# Patient Record
Sex: Male | Born: 1990 | Race: White | Hispanic: No | Marital: Single | Smoking: Never smoker
Health system: Southern US, Community
[De-identification: ages and names within clinical notes are randomized; demographics above are authoritative.]

## PROBLEM LIST (undated history)

## (undated) HISTORY — PX: TONSILLECTOMY: SUR1361

---
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
## 2018-05-25 ENCOUNTER — Other Ambulatory Visit: Payer: Self-pay

## 2018-05-25 ENCOUNTER — Encounter (HOSPITAL_BASED_OUTPATIENT_CLINIC_OR_DEPARTMENT_OTHER): Payer: Self-pay

## 2018-05-25 ENCOUNTER — Emergency Department (HOSPITAL_BASED_OUTPATIENT_CLINIC_OR_DEPARTMENT_OTHER): Payer: Self-pay

## 2018-05-25 ENCOUNTER — Emergency Department (HOSPITAL_BASED_OUTPATIENT_CLINIC_OR_DEPARTMENT_OTHER)
Admission: EM | Admit: 2018-05-25 | Discharge: 2018-05-25 | Disposition: A | Discharge status: Home / Self Care | Payer: Self-pay | Attending: Emergency Medicine | Admitting: Emergency Medicine

## 2018-05-25 DIAGNOSIS — K2920 Alcoholic gastritis without bleeding: Secondary | ICD-10-CM | POA: Insufficient documentation

## 2018-05-25 DIAGNOSIS — R112 Nausea with vomiting, unspecified: Secondary | ICD-10-CM

## 2018-05-25 DIAGNOSIS — K8020 Calculus of gallbladder without cholecystitis without obstruction: Secondary | ICD-10-CM | POA: Insufficient documentation

## 2018-05-25 DIAGNOSIS — K828 Other specified diseases of gallbladder: Secondary | ICD-10-CM | POA: Insufficient documentation

## 2018-05-25 DIAGNOSIS — D729 Disorder of white blood cells, unspecified: Secondary | ICD-10-CM | POA: Insufficient documentation

## 2018-05-25 DIAGNOSIS — R1010 Upper abdominal pain, unspecified: Secondary | ICD-10-CM | POA: Insufficient documentation

## 2018-05-25 LAB — COMPREHENSIVE METABOLIC PANEL
ALBUMIN: 5 g/dL (ref 3.5–5.0)
ALT: 15 U/L (ref 0–44)
AST: 39 U/L (ref 15–41)
Alkaline Phosphatase: 49 U/L (ref 38–126)
Anion gap: 15 (ref 5–15)
BUN: 9 mg/dL (ref 6–20)
CO2: 18 mmol/L — AB (ref 22–32)
CREATININE: 1.05 mg/dL (ref 0.61–1.24)
Calcium: 9.6 mg/dL (ref 8.9–10.3)
Chloride: 108 mmol/L (ref 98–111)
GFR calc Af Amer: >60 mL/min (ref >60)
GFR calc non Af Amer: >60 mL/min (ref >60)
GLUCOSE: 134 mg/dL — AB (ref 70–99)
Potassium: 3.5 mmol/L (ref 3.5–5.1)
SODIUM: 141 mmol/L (ref 135–145)
Total Bilirubin: 0.7 mg/dL (ref 0.3–1.2)
Total Protein: 8 g/dL (ref 6.5–8.1)

## 2018-05-25 LAB — LIPASE, BLOOD: LIPASE: 28 U/L (ref 11–51)

## 2018-05-25 LAB — CBC WITH DIFFERENTIAL/PLATELET
BASOS ABS: 0 10*3/uL (ref 0.0–0.1)
BASOS PCT: 0 %
EOS PCT: 0 %
Eosinophils Absolute: 0 10*3/uL (ref 0.0–0.7)
HCT: 41.9 % (ref 39.0–52.0)
Hemoglobin: 14.6 g/dL (ref 13.0–17.0)
Lymphocytes Relative: 5 %
Lymphs Abs: 0.7 10*3/uL (ref 0.7–4.0)
MCH: 29.3 pg (ref 26.0–34.0)
MCHC: 34.8 g/dL (ref 30.0–36.0)
MCV: 84.1 fL (ref 78.0–100.0)
MONO ABS: 0.7 10*3/uL (ref 0.1–1.0)
Monocytes Relative: 4 %
NEUTROS ABS: 14.8 10*3/uL — AB (ref 1.7–7.7)
Neutrophils Relative %: 91 %
PLATELETS: 273 10*3/uL (ref 150–400)
RBC: 4.98 MIL/uL (ref 4.22–5.81)
RDW: 13.3 % (ref 11.5–15.5)
WBC: 16.2 10*3/uL — AB (ref 4.0–10.5)

## 2018-05-25 LAB — URINALYSIS, ROUTINE W REFLEX MICROSCOPIC
BILIRUBIN URINE: NEGATIVE
Glucose, UA: NEGATIVE mg/dL
Hgb urine dipstick: NEGATIVE
KETONES UR: 15 mg/dL — AB
LEUKOCYTES UA: NEGATIVE
Nitrite: NEGATIVE
Protein, ur: NEGATIVE mg/dL
Specific Gravity, Urine: 1.015 (ref 1.005–1.030)
pH: 8.5 — ABNORMAL HIGH (ref 5.0–8.0)

## 2018-05-25 LAB — MAGNESIUM: MAGNESIUM: 1.6 mg/dL — AB (ref 1.7–2.4)

## 2018-05-25 LAB — CK: Total CK: 216 U/L (ref 49–397)

## 2018-05-25 MED ORDER — ONDANSETRON HCL 4 MG/2ML IJ SOLN
4.0000 mg | Freq: Once | INTRAMUSCULAR | Status: AC
  Administered 2018-05-25: 4 mg via INTRAVENOUS
  Filled 2018-05-25: qty 2

## 2018-05-25 MED ORDER — GI COCKTAIL ~~LOC~~
30.0000 mL | Freq: Once | ORAL | Status: AC
  Administered 2018-05-25: 30 mL via ORAL
  Filled 2018-05-25: qty 30

## 2018-05-25 MED ORDER — RANITIDINE HCL 150 MG PO TABS: 150.0000 mg | ORAL_TABLET | Freq: Two times a day (BID) | ORAL | 0 refills | Status: DC

## 2018-05-25 MED ORDER — DIPHENHYDRAMINE HCL 50 MG/ML IJ SOLN
25.0000 mg | Freq: Once | INTRAMUSCULAR | Status: AC
  Administered 2018-05-25: 25 mg via INTRAVENOUS
  Filled 2018-05-25: qty 1

## 2018-05-25 MED ORDER — FAMOTIDINE IN NACL 20-0.9 MG/50ML-% IV SOLN
20.0000 mg | Freq: Once | INTRAVENOUS | Status: AC
  Administered 2018-05-25: 20 mg via INTRAVENOUS
  Filled 2018-05-25: qty 50

## 2018-05-25 MED ORDER — SODIUM CHLORIDE 0.9 % IV BOLUS
2000.0000 mL | Freq: Once | INTRAVENOUS | Status: AC
  Administered 2018-05-25: 2000 mL via INTRAVENOUS

## 2018-05-25 MED ORDER — METOCLOPRAMIDE HCL 5 MG/ML IJ SOLN
10.0000 mg | Freq: Once | INTRAMUSCULAR | Status: AC
  Administered 2018-05-25: 10 mg via INTRAVENOUS
  Filled 2018-05-25: qty 2

## 2018-05-25 MED ORDER — METOCLOPRAMIDE HCL 10 MG PO TABS: 10.0000 mg | ORAL_TABLET | Freq: Four times a day (QID) | ORAL | 0 refills | Status: DC | PRN

## 2018-05-25 MED ORDER — SODIUM CHLORIDE 0.9 % IV BOLUS
1000.0000 mL | Freq: Once | INTRAVENOUS | Status: AC
  Administered 2018-05-25: 1000 mL via INTRAVENOUS

## 2018-05-25 NOTE — Discharge Instructions (Addendum)
Your abdominal pain is likely from gastritis or an ulcer, however you do have a gallstone and some gallbladder sludge in your gallbladder which could potentially be contributing to your symptoms today. You will need to follow up with a surgeon in the next few weeks to discuss whether your gallbladder needs to be removed or not.  You will need to take zantac as directed, and avoid spicy/fatty/acidic foods, avoid soda/coffee/tea/alcohol. Avoid laying down flat within 30 minutes of eating. Avoid NSAIDs like ibuprofen/aleve/motrin/etc on an empty stomach. May consider using over the counter tums/maalox as needed for additional relief. Use zofran given to you by the urgent care, or reglan given to you here, as directed as needed for nausea. Use tylenol as needed for pain. [facility] for recheck of symptoms and to establish medical care, as well as with the surgeon as previously mentioned. Return to the ER for changes or worsening symptoms.  Abdominal (belly) pain can be caused by many things. Your caregiver performed an examination and possibly ordered blood/urine tests and imaging (CT scan, x-rays, ultrasound). Many cases can be observed and treated at home after initial evaluation in the emergency department. Even though you are being discharged home, abdominal pain can be unpredictable. Therefore, you need a repeated exam if your pain does not resolve, returns, or worsens. Most patients with abdominal pain don't have to be admitted to the hospital or have surgery, but serious problems like appendicitis and gallbladder attacks can start out as nonspecific pain. Many abdominal conditions cannot be diagnosed in one visit, so follow-up evaluations are very important. SEEK IMMEDIATE MEDICAL ATTENTION IF YOU DEVELOP ANY OF THE FOLLOWING SYMPTOMS: The pain does not go away or becomes severe.  A temperature above 101 develops.  Repeated vomiting occurs (multiple  episodes).  The pain becomes localized to portions of the abdomen. The right side could possibly be appendicitis. In an adult, the left lower portion of the abdomen could be colitis or diverticulitis.  Blood is being passed in stools or vomit (bright red or black tarry stools).  Return also if you develop chest pain, difficulty breathing, dizziness or fainting, or become confused, poorly responsive, or inconsolable (young children). The constipation stays for more than 4 days.  There is belly (abdominal) or rectal pain.  You do not seem to be getting better.

## 2018-05-25 NOTE — ED Provider Notes (Signed)
[facility] EMERGENCY DEPARTMENT Provider Note   CSN: [id number] Arrival date & time: 05/25/18  1655     History   Chief Complaint Chief Complaint  Patient presents with  . Emesis    HPI Thomas Daniel is a 27 y.o. male with no reported PMHx, who presents to the ED with complaints of "feeling dehydrated" that began this morning.  Patient states that this morning he woke up vomiting, has had too numerous to count episodes of nonbloody nonbilious emesis, and feels generally weak/fatigued, having cramping in his hands bilaterally, and having abdominal pain.  He describes his pain as 8/10 intermittent aching nonradiating epigastric pain that worsens with activity and with no treatments for pain tried prior to arrival.  Chart review reveals that he went to [facility] just PTA, received 25mg  IM phenergan and rx for 8mg  ODT zofran and was discharged with instructions to drink copious amounts of fluids/gatorade.  He states that he left there and came immediately here because he felt so dehydrated and felt like the phenergan didn't really help much, so he came here for ongoing evaluation.  He did not pick up the rx for Zofran yet.  He admits that he drank 5 shots of liquor last night, but states that this is never happened to him just from alcohol use.  He denies having any medical problems and does not have a PCP currently.  He denies fevers, chills, CP, SOB, diarrhea/constipation, obstipation, melena, hematochezia, hematemesis, hematuria, dysuria, arthralgias, numbness, tingling, focal weakness, or any other complaints at this time. Denies recent travel, sick contacts, suspicious food intake, NSAID use, or prior abd surgeries.  Of note, the person he came with mentions that he looks a little yellow today.   The history is provided by the patient and medical records. No language interpreter was used.    History reviewed. No pertinent past medical history.  There are no active problems to  display for this patient.   Past Surgical History:  Procedure Laterality Date  . TONSILLECTOMY          Home Medications    Prior to Admission medications   Not on File    Family History No family history on file.  Social History Social History   Tobacco Use  . Smoking status: Never Smoker  . Smokeless tobacco: Never Used  Substance Use Topics  . Alcohol use: Yes    Comment: weekly  . Drug use: Not on file     Allergies   Patient has no known allergies.   Review of Systems Review of Systems  Constitutional: Positive for fatigue. Negative for chills and fever.  Respiratory: Negative for shortness of breath.   Cardiovascular: Negative for chest pain.  Gastrointestinal: Positive for abdominal pain, nausea and vomiting. Negative for blood in stool, constipation and diarrhea.  Genitourinary: Negative for dysuria and hematuria.  Musculoskeletal: Positive for myalgias (cramping in hands). Negative for arthralgias.  Skin: Positive for color change (?yellowish).  Allergic/Immunologic: Negative for immunocompromised state.  Neurological: Negative for weakness and numbness.  Psychiatric/Behavioral: Negative for confusion.   All other systems reviewed and are negative for acute change except as noted in the HPI.    Physical Exam Updated Vital Signs BP 119/70 (BP Location: Right Arm)   Pulse 90   Temp 97.8 F (36.6 C) (Oral)   Resp 20   Ht 5\' 8"  (1.727 m)   Wt 59 kg (130 lb)   SpO2 98%   BMI 19.77 kg/m  Physical Exam  Constitutional: He is oriented to person, place, and time. Vital signs are normal. He appears well-developed and well-nourished.  Non-toxic appearance. No distress.  Afebrile, nontoxic, looks like he feels unwell and dry heaving but otherwise in NAD  HENT:  Head: Normocephalic and atraumatic.  Mouth/Throat: Oropharynx is clear and moist and mucous membranes are normal.  Eyes: Conjunctivae and EOM are normal. Right eye exhibits no discharge.  Left eye exhibits no discharge.  Neck: Normal range of motion. Neck supple.  Cardiovascular: Normal rate, regular rhythm, normal heart sounds and intact distal pulses. Exam reveals no gallop and no friction rub.  No murmur heard. Pulmonary/Chest: Effort normal and breath sounds normal. No respiratory distress. He has no decreased breath sounds. He has no wheezes. He has no rhonchi. He has no rales.  Abdominal: Soft. Normal appearance and bowel sounds are normal. He exhibits no distension. There is tenderness in the right upper quadrant and epigastric area. There is positive Murphy's sign. There is no rigidity, no rebound, no guarding, no CVA tenderness and no tenderness at McBurney's point.  Soft, nondistended, +BS throughout, with moderate epigastric and RUQ TTP, no r/g/r, +murphy's, neg mcburney's, no CVA TTP   Musculoskeletal: Normal range of motion.  Neurological: He is alert and oriented to person, place, and time. He has normal strength. No sensory deficit.  Skin: Skin is warm, dry and intact. No rash noted.  Looks slightly jaundiced vs just somewhat pale  Psychiatric: He has a normal mood and affect.  Nursing note and vitals reviewed.    ED Treatments / Results  Labs (all labs ordered are listed, but only abnormal results are displayed) Labs Reviewed  CBC WITH DIFFERENTIAL/PLATELET - Abnormal; Notable for the following components:      Result Value   WBC 16.2 (*)    Neutro Abs 14.8 (*)    All other components within normal limits  COMPREHENSIVE METABOLIC PANEL - Abnormal; Notable for the following components:   CO2 18 (*)    Glucose, Bld 134 (*)    All other components within normal limits  URINALYSIS, ROUTINE W REFLEX MICROSCOPIC - Abnormal; Notable for the following components:   pH 8.5 (*)    Ketones, ur 15 (*)    All other components within normal limits  MAGNESIUM - Abnormal; Notable for the following components:   Magnesium 1.6 (*)    All other components within normal  limits  LIPASE, BLOOD  CK    EKG None  Radiology US Abdomen Complete  Result Date: 05/25/2018 CLINICAL DATA:  Initial evaluation for acute nausea, vomiting for 12 hours. EXAM: ABDOMEN ULTRASOUND COMPLETE COMPARISON:  None. FINDINGS: Gallbladder: Tiny 2 mm layering stones seen within the gallbladder lumen. Small amount of sludge noted as well. No significant free pericholecystic fluid. Gallbladder wall measures within normal limits. 4 mm echogenic lesion adherent to the gallbladder wall likely reflects a small polyp, of doubtful significance. No sonographic Murphy sign indicated by sonography. Common bile duct: Diameter: 4 mm Liver: No focal lesion identified. Within normal limits in parenchymal echogenicity. Portal vein is patent on color Doppler imaging with normal direction of blood flow towards the liver. IVC: No abnormality visualized. Pancreas: Visualized portion unremarkable. Spleen: Size and appearance within normal limits. Right Kidney: Length: 10.2 cm. Echogenicity within normal limits. No mass or hydronephrosis visualized. Left Kidney: Length: 10.4 cm. Echogenicity within normal limits. No mass or hydronephrosis visualized. Abdominal aorta: No aneurysm visualized. Other findings: None. IMPRESSION: 1. Small stone with sludge within  the gallbladder lumen. No other sonographic features for acute cholecystitis. 2. No biliary dilatation. 3. 4 mm polyp within the gallbladder lumen, felt to be an incidental finding of no clinical significance. Electronically Signed   By: Rise Mu M.D.   On: 05/25/2018 19:30    Procedures Procedures (including critical care time)  Medications Ordered in ED Medications  sodium chloride 0.9 % bolus 1,000 mL (1,000 mLs Intravenous New Bag/Given 05/25/18 2104)  sodium chloride 0.9 % bolus 2,000 mL (0 mLs Intravenous Stopped 05/25/18 1841)  ondansetron (ZOFRAN) injection 4 mg (4 mg Intravenous Given 05/25/18 1740)  famotidine (PEPCID) IVPB 20 mg premix (0  mg Intravenous Stopped 05/25/18 1814)  gi cocktail (Maalox,Lidocaine,Donnatal) (30 mLs Oral Given 05/25/18 1817)  metoCLOPramide (REGLAN) injection 10 mg (10 mg Intravenous Given 05/25/18 2104)  diphenhydrAMINE (BENADRYL) injection 25 mg (25 mg Intravenous Given 05/25/18 2108)     Initial Impression / Assessment and Plan / ED Course  I have reviewed the triage vital signs and the nursing notes.  Pertinent labs & imaging results that were available during my care of the patient were reviewed by me and considered in my medical decision making (see chart for details).     27 y.o. male here with upper abd pain and n/v that began this morning. States he's also having hand cramping and generalized weakness. Seen at [facility] just PTA, given 25mg  IM phenergan which didn't help, he's concerned he's dehydrated so he came here. On exam, appears slightly jaundiced, moderate epigastric and RUQ TTP, +murphy's, nonperitoneal. VSS. Will get labs including CK and Mg levels, and get abd U/S to assess for biliary tree etiology; will give pepcid, zofran, fluids, and GI cocktail then reassess shortly.   8:46 PM CBC w/diff with neutrophilic leukocytosis WBC 16.2. CMP with bicarb 18 and gluc 134 but otherwise WNL, no anion gap, kidney function stable; low bicarb likely from mild dehydration. Lipase WNL. Mg level marginally low at 1.6, doubt need for repletion. CK level WNL at 216. U/A with a few ketones but otherwise without UTI. Abd U/S with small stone with sludge in gallbladder without evidence of cholecystitis, no biliary dilation, and 4mm polyp in gallbladder which is likely incidental.  Pt initially felt somewhat better but now vomiting again; will attempt reglan/benadryl and reassess. If this doesn't work, may consider ativan or haldol, but will hold off for now. Will give another 1L bolus since he still complains of feeling dehydrated despite 2L bolus. Will reassess shortly.   9:55 PM Pt feeling better and tolerating  PO well. Overall, symptoms consistent with gastritis/GERD/PUD but could be in part due to gallbladder pathology. Discussed diet/lifestyle modifications for symptoms, will start on zantac/reglan, discussed that he can also use home zofran as well, advised tylenol and avoidance/sparing use of NSAIDs only on full stomach, discussed other OTC remedies for symptomatic relief, and f/up with [facility] in 5-7 days for recheck of symptoms and ongoing evaluation/management, and to establish medical care. EtOH cessation strongly encouraged. Could still be biliary dyskinesia, advised possible further outpatient work up with HIDA scan and possible surgical consultation to see if he needs his gallbladder taken out. Will give referral to surgery, advised to f/up in a few weeks if symptoms become recurrent/persistent. I explained the diagnosis and have given explicit precautions to return to the ER including for any other new or worsening symptoms. The patient understands and accepts the medical plan as it's been dictated and I have answered their questions. Discharge instructions concerning home care  and prescriptions have been given. The patient is STABLE and is discharged to home in good condition.    Final Clinical Impressions(s) / ED Diagnoses   Final diagnoses:  Upper abdominal pain  Nausea and vomiting in adult patient  Acute alcoholic gastritis without hemorrhage  Neutrophilic leukocytosis  Gallbladder sludge  Calculus of gallbladder without cholecystitis without obstruction    ED Discharge Orders        Ordered    metoCLOPramide (REGLAN) 10 MG tablet  Every 6 hours PRN     05/25/18 2137    ranitidine (ZANTAC) 150 MG tablet  2 times daily     05/25/18 2137       Kostantinos Tallman, Pleasant PlainMercedes, [place] JerseyPA-C 05/25/18 2155    Pricilla LovelessGoldston, [place], MD 05/27/18 1306

## 2018-05-25 NOTE — ED Triage Notes (Signed)
[facility] was given phenergan and came directly here because he states he needs fluids and is unable to hold anything down, pt c/o abdominal cramping, drank alcohol last night but states that his symptoms are not from that

## 2018-08-25 ENCOUNTER — Encounter (HOSPITAL_BASED_OUTPATIENT_CLINIC_OR_DEPARTMENT_OTHER): Payer: Self-pay | Admitting: *Deleted

## 2018-08-25 ENCOUNTER — Emergency Department (HOSPITAL_BASED_OUTPATIENT_CLINIC_OR_DEPARTMENT_OTHER): Payer: Self-pay

## 2018-08-25 ENCOUNTER — Emergency Department (HOSPITAL_BASED_OUTPATIENT_CLINIC_OR_DEPARTMENT_OTHER)
Admission: EM | Admit: 2018-08-25 | Discharge: 2018-08-25 | Disposition: A | Discharge status: Home / Self Care | Payer: Self-pay | Attending: Emergency Medicine | Admitting: Emergency Medicine

## 2018-08-25 ENCOUNTER — Other Ambulatory Visit: Payer: Self-pay

## 2018-08-25 DIAGNOSIS — R1011 Right upper quadrant pain: Secondary | ICD-10-CM | POA: Insufficient documentation

## 2018-08-25 DIAGNOSIS — K8080 Other cholelithiasis without obstruction: Secondary | ICD-10-CM | POA: Insufficient documentation

## 2018-08-25 DIAGNOSIS — Z79899 Other long term (current) drug therapy: Secondary | ICD-10-CM | POA: Insufficient documentation

## 2018-08-25 LAB — CBC WITH DIFFERENTIAL/PLATELET
Abs Immature Granulocytes: 0.06 10*3/uL (ref 0.00–0.07)
BASOS ABS: 0.1 10*3/uL (ref 0.0–0.1)
BASOS PCT: 1 %
Eosinophils Absolute: 0 10*3/uL (ref 0.0–0.5)
Eosinophils Relative: 0 %
HEMATOCRIT: 46 % (ref 39.0–52.0)
Hemoglobin: 15.2 g/dL (ref 13.0–17.0)
Immature Granulocytes: 1 %
LYMPHS PCT: 7 %
Lymphs Abs: 0.9 10*3/uL (ref 0.7–4.0)
MCH: 28.5 pg (ref 26.0–34.0)
MCHC: 33 g/dL (ref 30.0–36.0)
MCV: 86.3 fL (ref 80.0–100.0)
MONO ABS: 0.6 10*3/uL (ref 0.1–1.0)
Monocytes Relative: 5 %
NRBC: 0 % (ref 0.0–0.2)
Neutro Abs: 11.2 10*3/uL — ABNORMAL HIGH (ref 1.7–7.7)
Neutrophils Relative %: 86 %
PLATELETS: 290 10*3/uL (ref 150–400)
RBC: 5.33 MIL/uL (ref 4.22–5.81)
RDW: 13 % (ref 11.5–15.5)
WBC: 12.8 10*3/uL — AB (ref 4.0–10.5)

## 2018-08-25 LAB — RAPID URINE DRUG SCREEN, HOSP PERFORMED
AMPHETAMINES: NOT DETECTED
BARBITURATES: NOT DETECTED
Benzodiazepines: NOT DETECTED
COCAINE: NOT DETECTED
Opiates: NOT DETECTED
Tetrahydrocannabinol: POSITIVE — AB

## 2018-08-25 LAB — COMPREHENSIVE METABOLIC PANEL
ALBUMIN: 5.1 g/dL — AB (ref 3.5–5.0)
ALT: 12 U/L (ref 0–44)
AST: 31 U/L (ref 15–41)
Alkaline Phosphatase: 44 U/L (ref 38–126)
Anion gap: 16 — ABNORMAL HIGH (ref 5–15)
BUN: 11 mg/dL (ref 6–20)
CHLORIDE: 106 mmol/L (ref 98–111)
CO2: 17 mmol/L — AB (ref 22–32)
Calcium: 10.1 mg/dL (ref 8.9–10.3)
Creatinine, Ser: 1.2 mg/dL (ref 0.61–1.24)
GFR calc Af Amer: >60 mL/min (ref >60)
GFR calc non Af Amer: >60 mL/min (ref >60)
GLUCOSE: 137 mg/dL — AB (ref 70–99)
Potassium: 3.3 mmol/L — ABNORMAL LOW (ref 3.5–5.1)
SODIUM: 139 mmol/L (ref 135–145)
Total Bilirubin: 1 mg/dL (ref 0.3–1.2)
Total Protein: 8.1 g/dL (ref 6.5–8.1)

## 2018-08-25 LAB — URINALYSIS, ROUTINE W REFLEX MICROSCOPIC
Bilirubin Urine: NEGATIVE
GLUCOSE, UA: NEGATIVE mg/dL
Ketones, ur: >80 mg/dL — AB
Leukocytes, UA: NEGATIVE
NITRITE: NEGATIVE
PH: 8.5 — AB (ref 5.0–8.0)
Protein, ur: NEGATIVE mg/dL
Specific Gravity, Urine: 1.02 (ref 1.005–1.030)

## 2018-08-25 LAB — LIPASE, BLOOD: Lipase: 27 U/L (ref 11–51)

## 2018-08-25 LAB — URINALYSIS, MICROSCOPIC (REFLEX)

## 2018-08-25 MED ORDER — ONDANSETRON HCL 8 MG PO TABS
4.0000 mg | ORAL_TABLET | Freq: Once | ORAL | Status: AC
  Administered 2018-08-25: 4 mg via ORAL
  Filled 2018-08-25: qty 1

## 2018-08-25 MED ORDER — SODIUM CHLORIDE 0.9 % IV BOLUS
1000.0000 mL | Freq: Once | INTRAVENOUS | Status: AC
Start: 2018-08-25 — End: 2018-08-25
  Administered 2018-08-25: 1000 mL via INTRAVENOUS

## 2018-08-25 MED ORDER — ONDANSETRON HCL 4 MG/2ML IJ SOLN
4.0000 mg | Freq: Once | INTRAMUSCULAR | Status: AC
  Administered 2018-08-25: 4 mg via INTRAVENOUS
  Filled 2018-08-25: qty 2

## 2018-08-25 MED ORDER — HYDROMORPHONE HCL 1 MG/ML IJ SOLN
0.5000 mg | Freq: Once | INTRAMUSCULAR | Status: AC
  Administered 2018-08-25: 0.5 mg via INTRAVENOUS
  Filled 2018-08-25: qty 1

## 2018-08-25 NOTE — ED Notes (Signed)
Pt states he is unable to void at this time.  IV fluids running to gravity, due to patient unable to keep arm straight and doesn't like the pump beeping.  Fluids running well at present.

## 2018-08-25 NOTE — Discharge Instructions (Signed)
Please read and follow all provided instructions.  Your diagnoses today include:  1. Biliary calculus of other site without obstruction   2. Right upper quadrant pain     Tests performed today include:  Blood counts and electrolytes  Blood tests to check liver and kidney function -normal  Blood tests to check pancreas function -normal  Urine test to look for infection  Ultrasound -shows that you may have a small stone in the tube outside of the gallbladder  Vital signs. See below for your results today.   Medications prescribed:   None  Take any prescribed medications only as directed.  Home care instructions:   Follow any educational materials contained in this packet.  Follow-up instructions: Please follow-up with your primary care provider in the next 3 days for further evaluation of your symptoms and the surgeon listed in the next week.   Return instructions:  SEEK IMMEDIATE MEDICAL ATTENTION IF:  The pain does not go away or becomes severe   A temperature above 101F develops   Repeated vomiting occurs (multiple episodes)   If you develop yellow coloring in your skin or severe abdominal pain that goes to your back.  The pain becomes localized to portions of the abdomen. The right side could possibly be appendicitis. In an adult, the left lower portion of the abdomen could be colitis or diverticulitis.   Blood is being passed in stools or vomit (bright red or black tarry stools)   You develop chest pain, difficulty breathing, dizziness or fainting, or become confused, poorly responsive, or inconsolable (young children)  If you have any other emergent concerns regarding your health  Additional Information: Abdominal (belly) pain can be caused by many things. Your caregiver performed an examination and possibly ordered blood/urine tests and imaging (CT scan, x-rays, ultrasound). Many cases can be observed and treated at home after initial evaluation in the  emergency department. Even though you are being discharged home, abdominal pain can be unpredictable. Therefore, you need a repeated exam if your pain does not resolve, returns, or worsens. Most patients with abdominal pain don't have to be admitted to the hospital or have surgery, but serious problems like appendicitis and gallbladder attacks can start out as nonspecific pain. Many abdominal conditions cannot be diagnosed in one visit, so follow-up evaluations are very important.  Your vital signs today were: BP 91/62 (BP Location: Right Arm)    Pulse 84    Temp 97.8 F (36.6 C) (Oral)    Resp 18    Ht 5\' 8"  (1.727 m)    Wt 59 kg    SpO2 100%    BMI 19.77 kg/m  If your blood pressure (bp) was elevated above 135/85 this visit, please have this repeated by your doctor within one month. --------------

## 2018-08-25 NOTE — ED Triage Notes (Signed)
Mom states that pt has been seen here before for dehydration, she said that he is showing same signs and symptoms as before. Has vomited 12 times today.

## 2018-08-25 NOTE — ED Provider Notes (Signed)
[facility] EMERGENCY DEPARTMENT Provider Note   CSN: [id number] Arrival date & time: 08/25/18  1303     History   Chief Complaint Chief Complaint  Patient presents with  . Dehydration    HPI Thomas Daniel is a 27 y.o. male.  Patient with known cholelithiasis and gallbladder sludge presents with upper abdominal pain and vomiting starting acutely approximately 10 AM today.  Patient has been persistently vomiting since that time.  Pain does not radiate.  No chest pain or shortness of breath.  No fevers.  He has had some watery stool without blood.  No urinary symptoms.  Patient is concerned that he is dehydrated.  Denies alcohol use.  Patient admits to smoking marijuana approximately 1 time per day.  The onset of this condition was acute. The course is constant. Aggravating factors: palpation. Alleviating factors: none.       History reviewed. No pertinent past medical history.  There are no active problems to display for this patient.   Past Surgical History:  Procedure Laterality Date  . TONSILLECTOMY          Home Medications    Prior to Admission medications   Medication Sig Start Date End Date Taking? Authorizing Provider  metoCLOPramide (REGLAN) 10 MG tablet Take 1 tablet (10 mg total) by mouth every 6 (six) hours as needed for nausea. 05/25/18   Street, [place], PA-C  ranitidine (ZANTAC) 150 MG tablet Take 1 tablet (150 mg total) by mouth 2 (two) times daily. 05/25/18   Street, [place], PA-C    Family History History reviewed. No pertinent family history.  Social History Social History   Tobacco Use  . Smoking status: Never Smoker  . Smokeless tobacco: Never Used  Substance Use Topics  . Alcohol use: Yes    Comment: weekly  . Drug use: Not on file     Allergies   Patient has no known allergies.   Review of Systems Review of Systems  Constitutional: Negative for fever.  HENT: Negative for rhinorrhea and sore throat.   Eyes: Negative  for redness.  Respiratory: Negative for cough.   Cardiovascular: Negative for chest pain.  Gastrointestinal: Positive for abdominal pain, diarrhea, nausea and vomiting.  Genitourinary: Negative for dysuria.  Musculoskeletal: Negative for myalgias.  Skin: Negative for rash.  Neurological: Negative for headaches.     Physical Exam Updated Vital Signs BP (!) 150/93 (BP Location: Right Arm)   Pulse 80   Temp 97.8 F (36.6 C) (Oral)   Resp (!) 22   Ht 5\' 8"  (1.727 m)   Wt 59 kg   SpO2 100%   BMI 19.77 kg/m   Physical Exam  Constitutional: He appears well-developed and well-nourished.  HENT:  Head: Normocephalic and atraumatic.  Eyes: Conjunctivae are normal. Right eye exhibits no discharge. Left eye exhibits no discharge.  Neck: Normal range of motion. Neck supple.  Cardiovascular: Normal rate, regular rhythm and normal heart sounds.  Pulmonary/Chest: Effort normal and breath sounds normal. No respiratory distress.  Abdominal: Soft. There is tenderness (Moderate epigastric and right upper quadrant abdominal tenderness). There is no rebound and no guarding.  Neurological: He is alert.  Skin: Skin is warm and dry.  Psychiatric: He has a normal mood and affect.  Nursing note and vitals reviewed.    ED Treatments / Results  Labs (all labs ordered are listed, but only abnormal results are displayed) Labs Reviewed  CBC WITH DIFFERENTIAL/PLATELET - Abnormal; Notable for the following components:  Result Value   WBC 12.8 (*)    Neutro Abs 11.2 (*)    All other components within normal limits  COMPREHENSIVE METABOLIC PANEL - Abnormal; Notable for the following components:   Potassium 3.3 (*)    CO2 17 (*)    Glucose, Bld 137 (*)    Albumin 5.1 (*)    Anion gap 16 (*)    All other components within normal limits  URINALYSIS, ROUTINE W REFLEX MICROSCOPIC - Abnormal; Notable for the following components:   pH 8.5 (*)    Hgb urine dipstick SMALL (*)    Ketones, ur >80 (*)     All other components within normal limits  RAPID URINE DRUG SCREEN, HOSP PERFORMED - Abnormal; Notable for the following components:   Tetrahydrocannabinol POSITIVE (*)    All other components within normal limits  URINALYSIS, MICROSCOPIC (REFLEX) - Abnormal; Notable for the following components:   Bacteria, UA RARE (*)    All other components within normal limits  LIPASE, BLOOD    EKG None  Radiology Dg Abd Acute W/chest  Result Date: 08/25/2018 CLINICAL DATA:  Upper abdominal pain with vomiting and diarrhea since this morning. EXAM: DG ABDOMEN ACUTE W/ 1V CHEST COMPARISON:  CT, 03/11/2016 FINDINGS: There is no evidence of dilated bowel loops or free intraperitoneal air. No radiopaque calculi or other significant radiographic abnormality is seen. Heart size and mediastinal contours are within normal limits. Both lungs are clear. IMPRESSION: Negative abdominal radiographs.  No acute cardiopulmonary disease. Electronically Signed   By: Amie Portland M.D.   On: 08/25/2018 14:59   US Abdomen Limited Ruq  Result Date: 08/25/2018 CLINICAL DATA:  Right upper quadrant pain EXAM: ULTRASOUND ABDOMEN LIMITED RIGHT UPPER QUADRANT COMPARISON:  05/25/2018 FINDINGS: Gallbladder: Small 3 mm posterior gallbladder wall polyp. There appears to be a 3 mm stone possibly within the cystic duct. The patient was tender over the gallbladder during the study. No visible gallbladder stones currently. Common bile duct: Diameter: Normal caliber, 3 mm Liver: No focal lesion identified. Within normal limits in parenchymal echogenicity. Portal vein is patent on color Doppler imaging with normal direction of blood flow towards the liver. IMPRESSION: 3 mm calcification possibly within the cystic duct. No gallbladder wall thickening, but the patient was tender over the gallbladder during the study. 3 mm posterior gallbladder wall polyp. Electronically Signed   By: Charlett Nose M.D.   On: 08/25/2018 17:45     Procedures Procedures (including critical care time)  Medications Ordered in ED Medications  ondansetron (ZOFRAN) tablet 4 mg (4 mg Oral Given 08/25/18 1322)  sodium chloride 0.9 % bolus 1,000 mL ( Intravenous Stopped 08/25/18 1438)  ondansetron (ZOFRAN) injection 4 mg (4 mg Intravenous Given 08/25/18 1434)  HYDROmorphone (DILAUDID) injection 0.5 mg (0.5 mg Intravenous Given 08/25/18 1528)     Initial Impression / Assessment and Plan / ED Course  I have reviewed the triage vital signs and the nursing notes.  Pertinent labs & imaging results that were available during my care of the patient were reviewed by me and considered in my medical decision making (see chart for details).     Patient seen and examined. Work-up initiated. Medications ordered. Reviewed imaging from last visit.   Vital signs reviewed and are as follows: BP (!) 150/93 (BP Location: Right Arm)   Pulse 80   Temp 97.8 F (36.6 C) (Oral)   Resp (!) 22   Ht 5\' 8"  (1.727 m)   Wt 59 kg  SpO2 100%   BMI 19.77 kg/m   3:19 PM will reevaluate with right upper quadrant ultrasound.  Additional pain medication ordered.  Patient appears more comfortable at this time.  Plain films do not show any sign of free air or small bowel obstruction.  6:21 PM patient symptoms have resolved.  Upon entering the room, he is sitting up in chair and his mother is lying on the bed because of her disc problems.  On reexam, patient has no reproducible right upper quadrant abdominal pain on exam.  He is asking for something to drink.  We discussed his ultrasound findings.  He does not want to be admitted today.  He states that he does not currently have health insurance but will in December.  For these reasons he wants to avoid admission or any medical procedures.  We discussed that if the stone continues to move he may have recurrence of pain.  We discussed that he could develop hepatitis or pancreatitis due to a possible gallstone  in the biliary system.  He states that he understands this.  We discussed signs of biliary obstruction including pain and change of skin color and signs of pancreatitis including pain, vomiting, and radiation of pain to the back.  If these occur, patient is encouraged to return or go to [facility], [facility], or [facility] for further evaluation.  Discussed that he may need a procedure to remove the stone if this occurs.    Patient will be discharged at this time.  Final Clinical Impressions(s) / ED Diagnoses   Final diagnoses:  Right upper quadrant pain  Biliary calculus of other site without obstruction   Patient with right upper quadrant pain, potentially due to cholelithiasis.  No signs of cholecystitis today.  Mild leukocytosis likely due to pain.  Patient had multiple episodes of vomiting here.  Normal transaminases.  Normal lipase.  There is a stone in the cystic duct.  Discussed evaluation and treatment for this.  Patient does not want to stay in the hospital given that his symptoms are now completely resolved.  We discussed signs and symptoms to return as above.  He is given surgery follow-up and strongly encouraged to follow-up.  ED Discharge Orders    None       Renne Crigler, PA-C 08/25/18 1825    Alvira Monday, MD 08/26/18 1221

## 2019-04-30 IMAGING — DX DG ABDOMEN ACUTE W/ 1V CHEST
3 series · 3 of 3 positions shown · non-contrast
Comparison: CT, 03/11/2016

CLINICAL DATA: Upper abdominal pain with vomiting and diarrhea
since this morning.

EXAM:
DG ABDOMEN ACUTE W/ 1V CHEST

[chest pa]
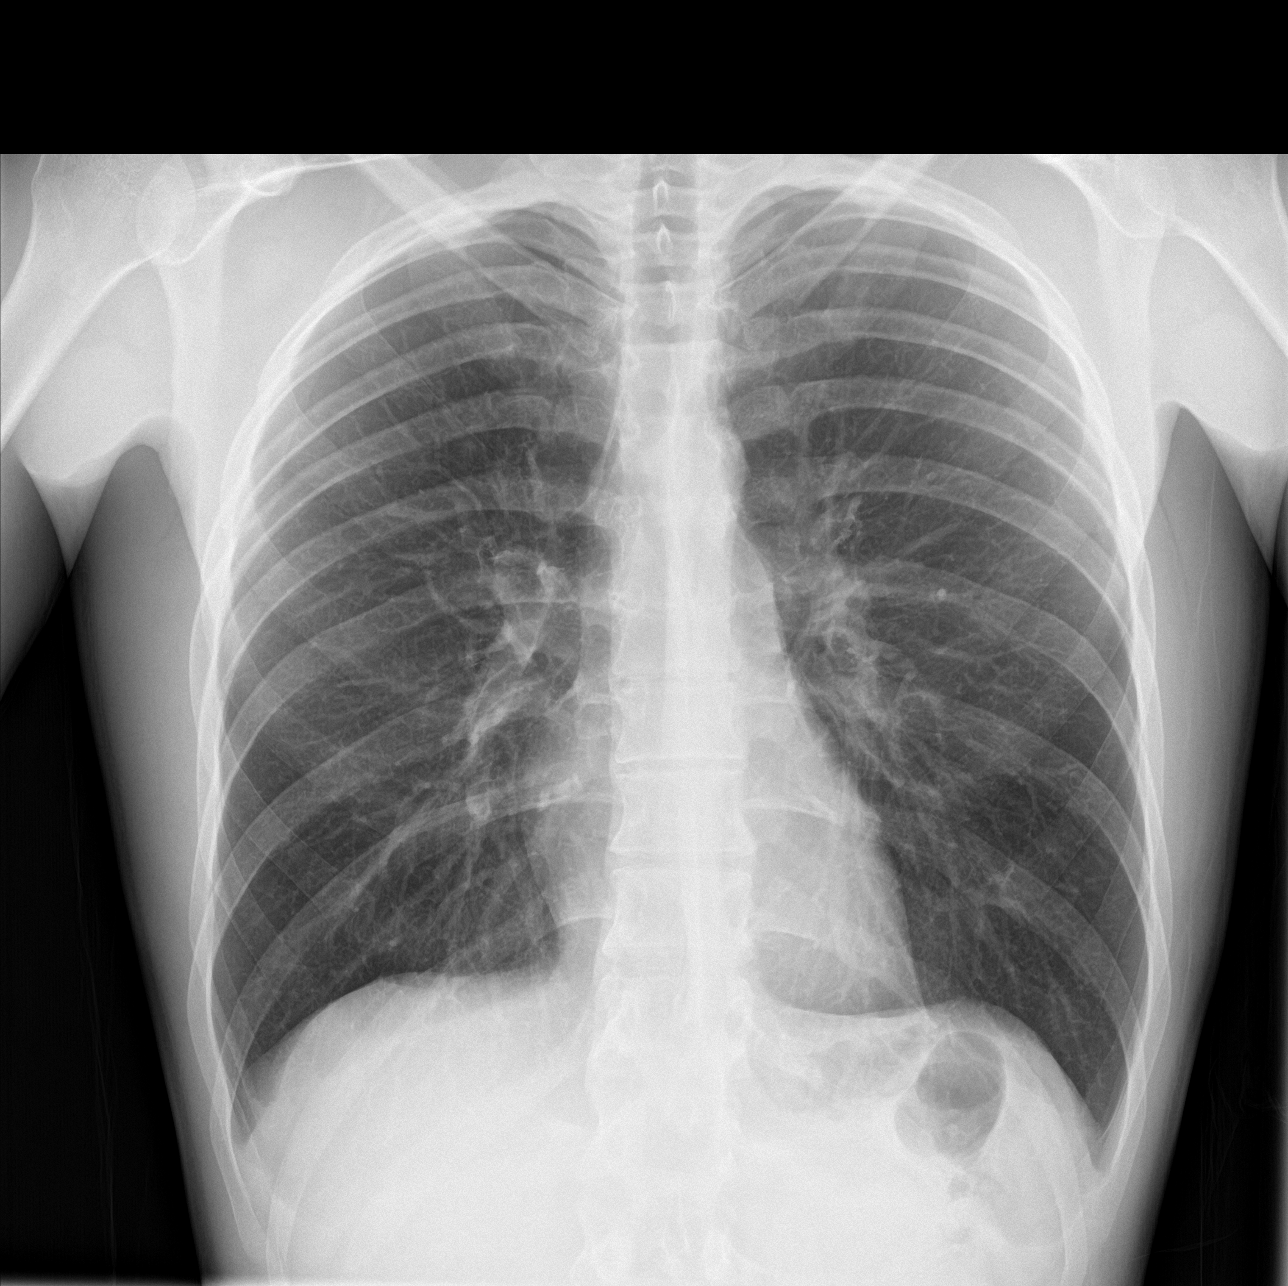

[abdomen erect]
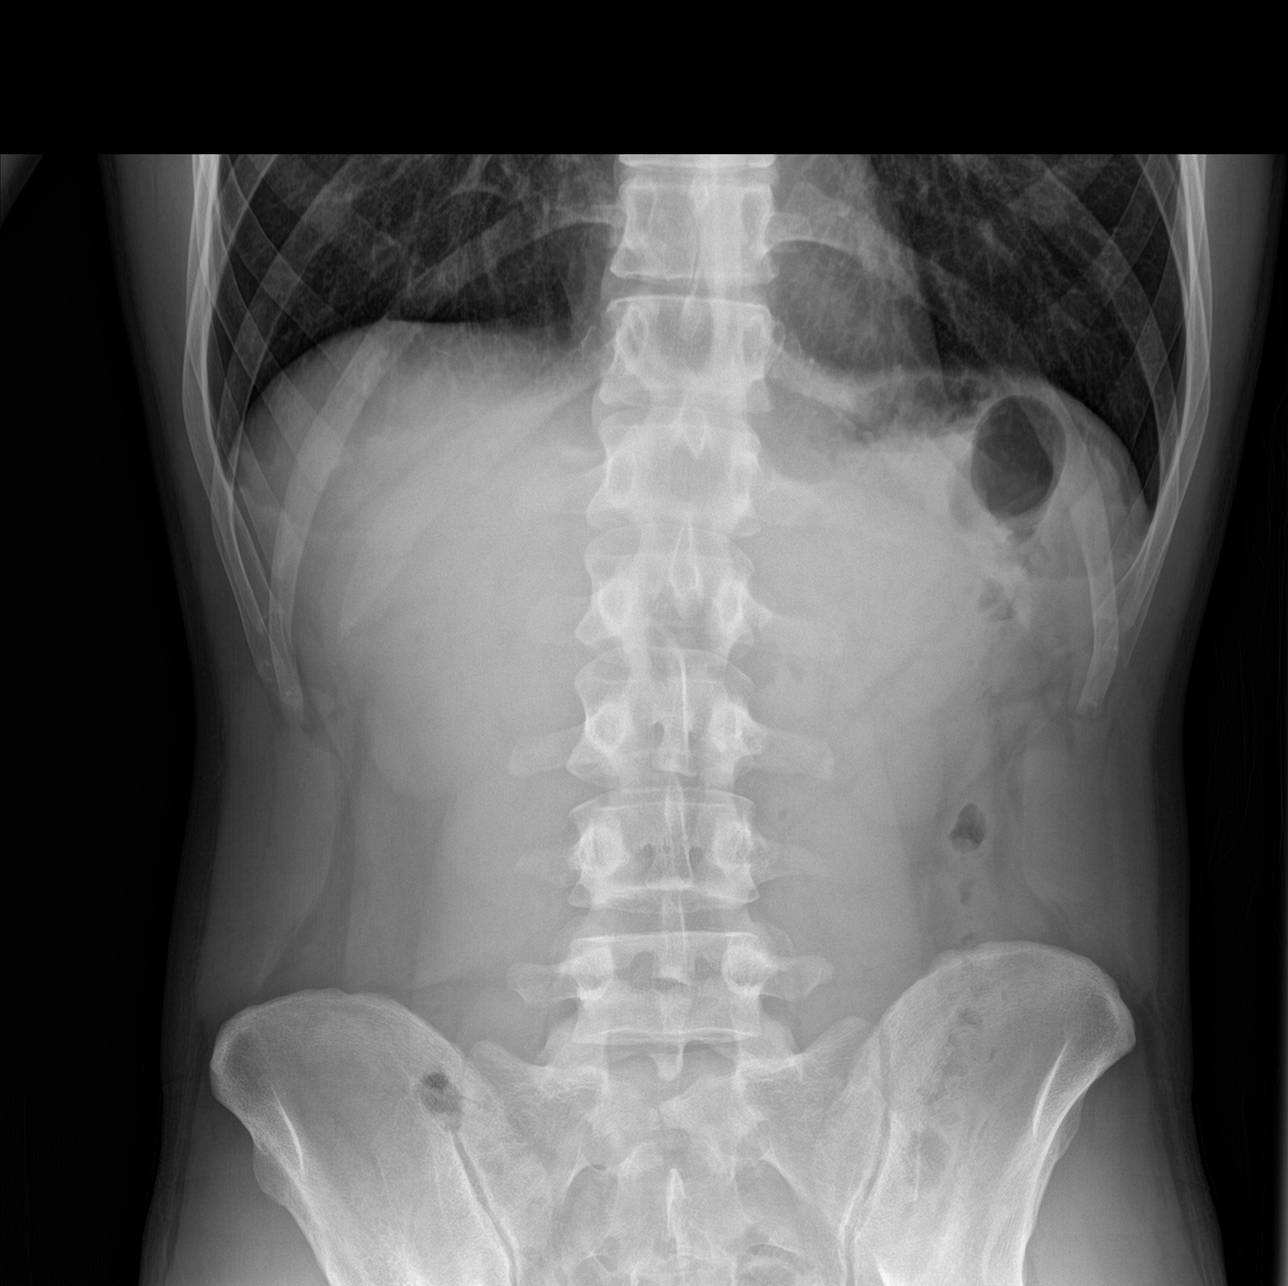

[abdomen supine]
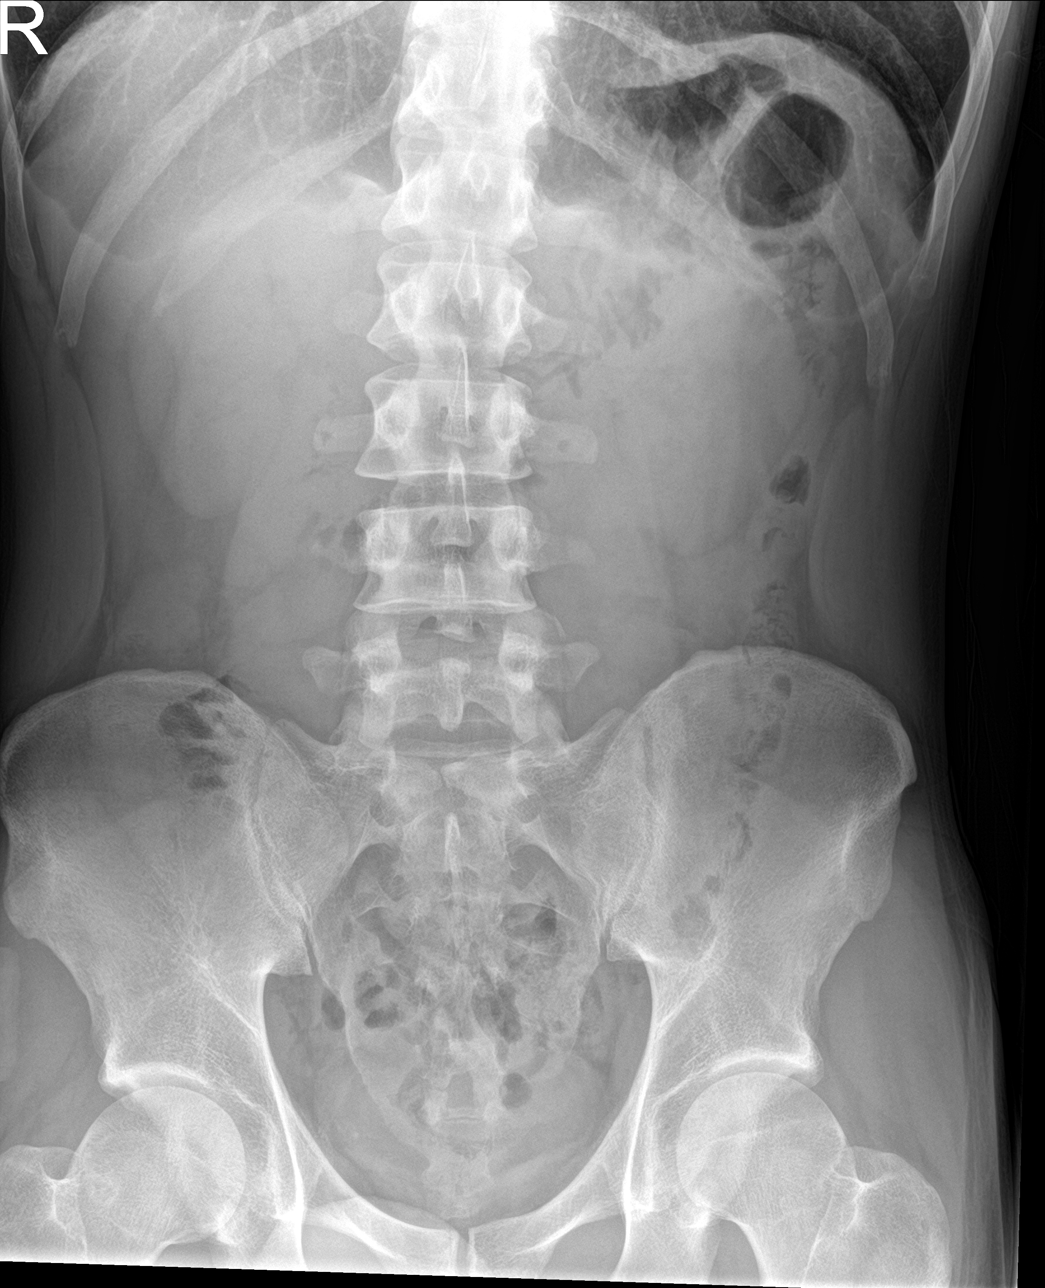

[3 of 3 positions shown; findings below may reference images not displayed]

FINDINGS: There is no evidence of dilated bowel loops or free intraperitoneal
air. No radiopaque calculi or other significant radiographic
abnormality is seen. Heart size and mediastinal contours are within
normal limits. Both lungs are clear.
IMPRESSION: Negative abdominal radiographs.  No acute cardiopulmonary disease.

## 2020-02-07 IMAGING — US US ABDOMEN LIMITED
1 series · 14 of 25 positions shown · non-contrast
Comparison: 05/25/2018

CLINICAL DATA: Right upper quadrant pain

EXAM:
ULTRASOUND ABDOMEN LIMITED RIGHT UPPER QUADRANT

[Series 1: us abdomen limited · 0.12mm/px · 14 of 46 slices shown]
[im 1/46]
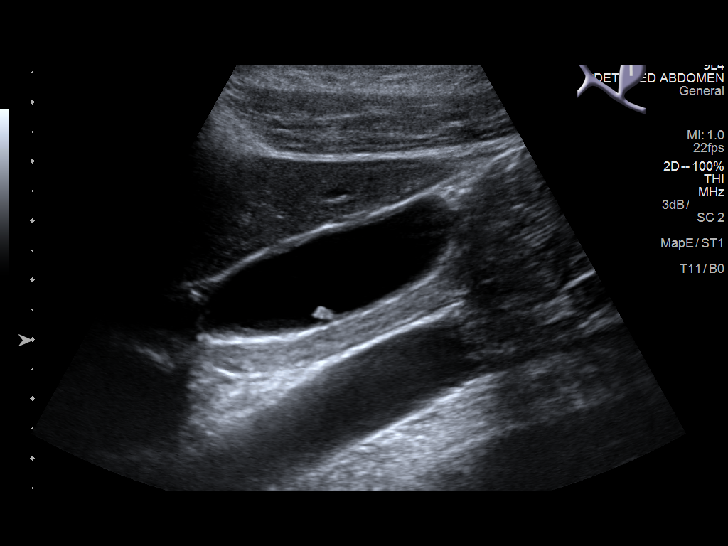
[im 4/46]
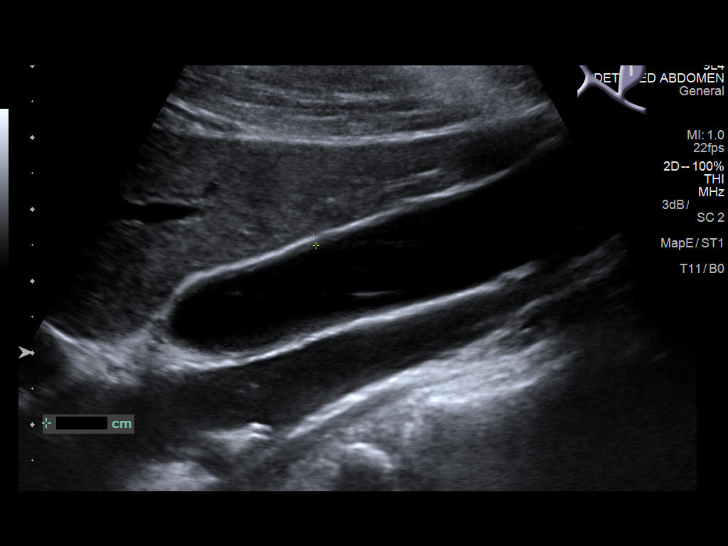
[im 8/46]
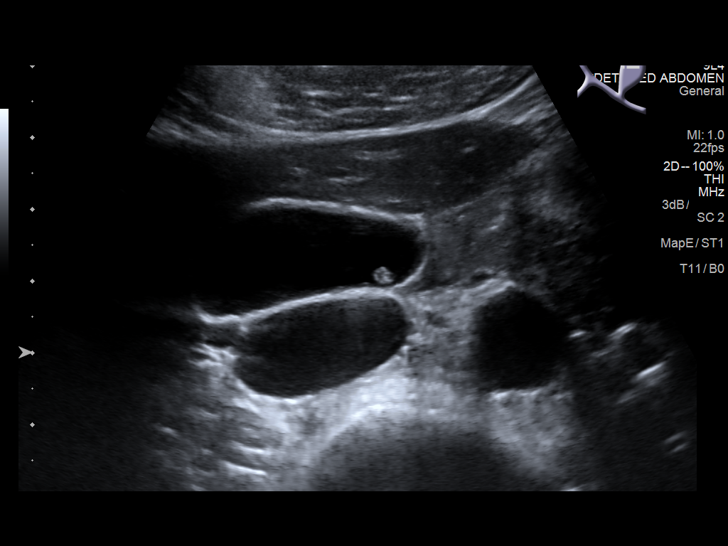
[im 12/46]
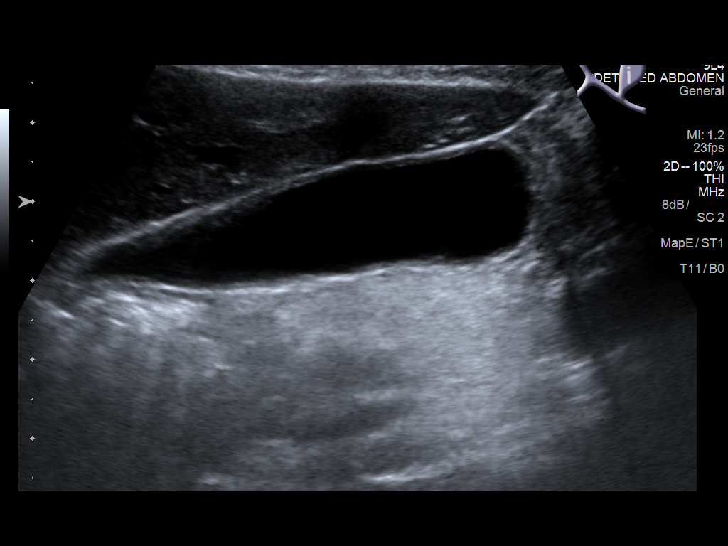
[im 16/46]
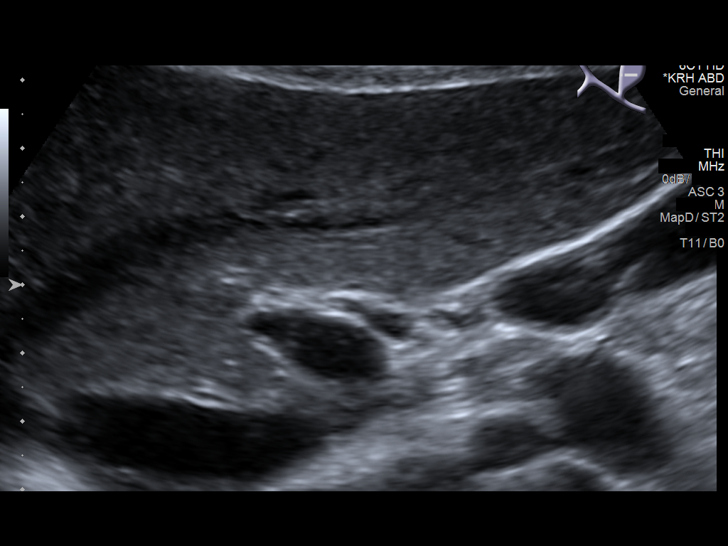
[im 17/46]
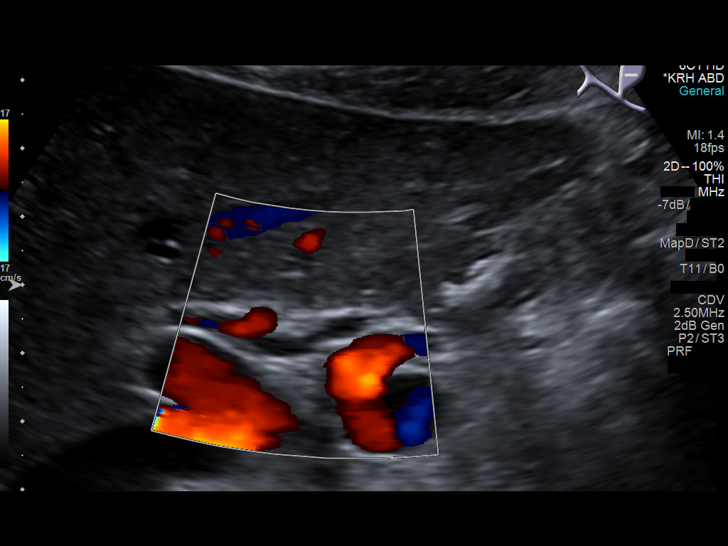
[im 21/46]
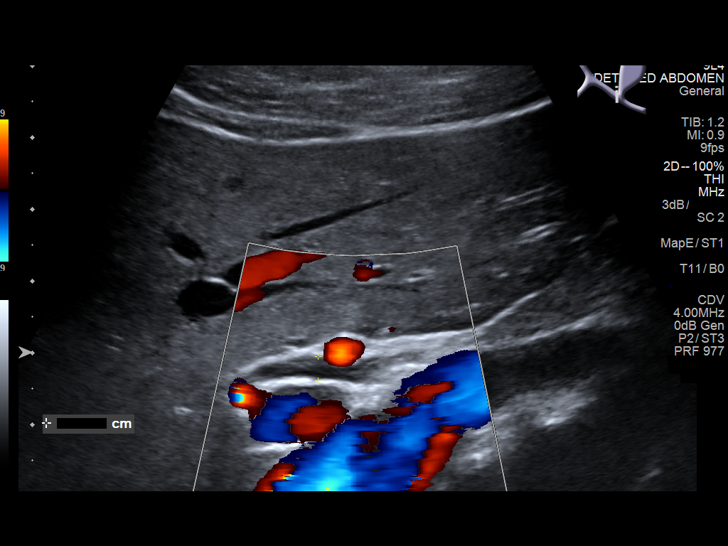
[im 25/46]
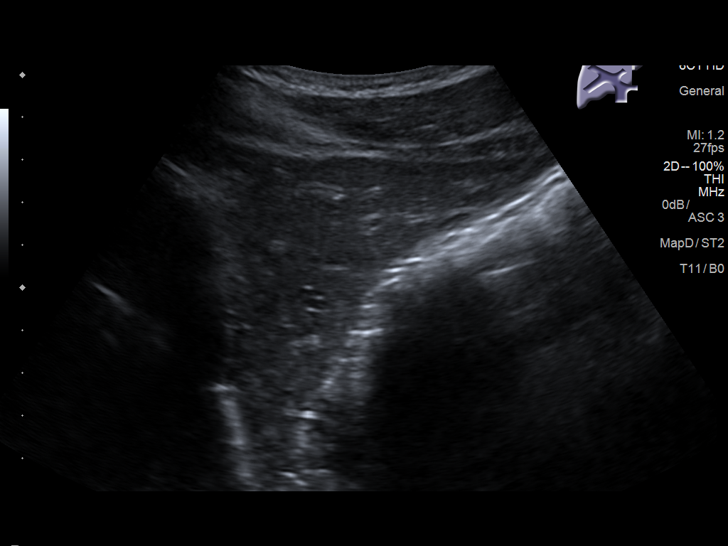
[im 29/46]
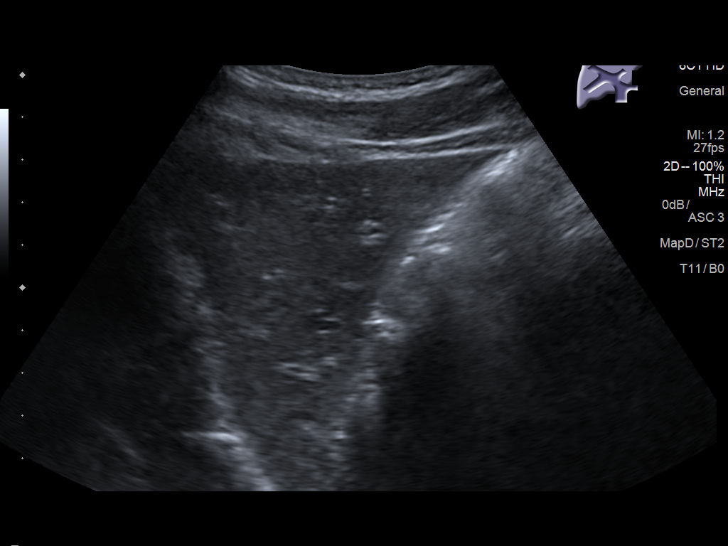
[im 31/46]
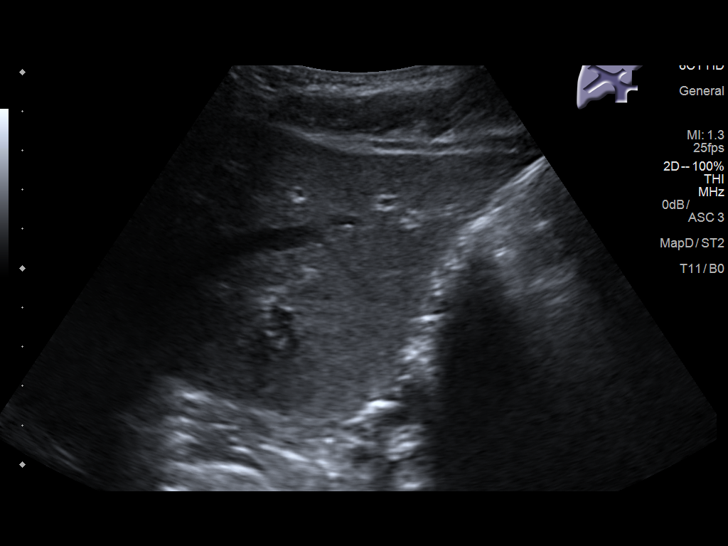
[im 34/46]
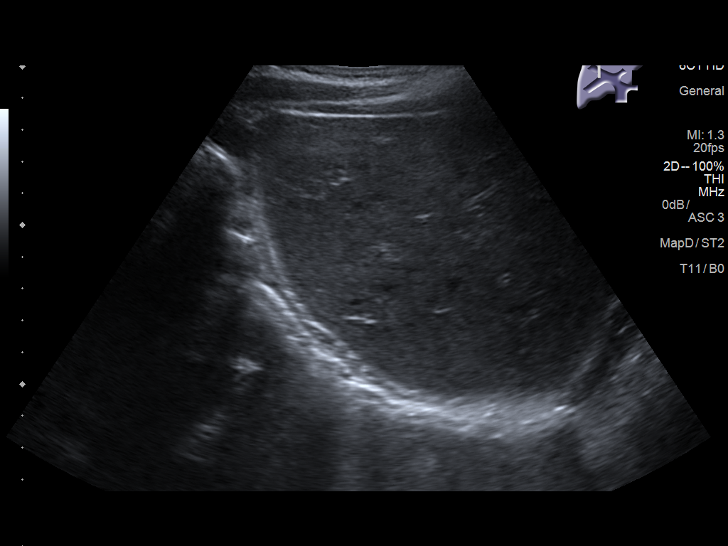
[im 38/46]
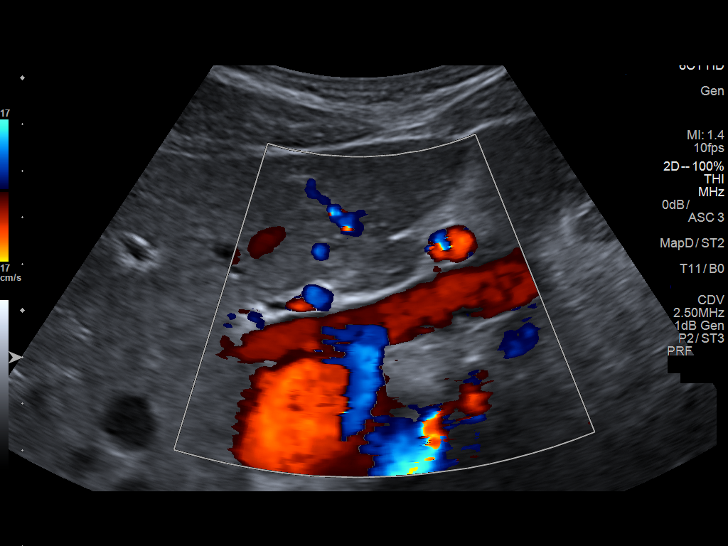
[im 42/46]
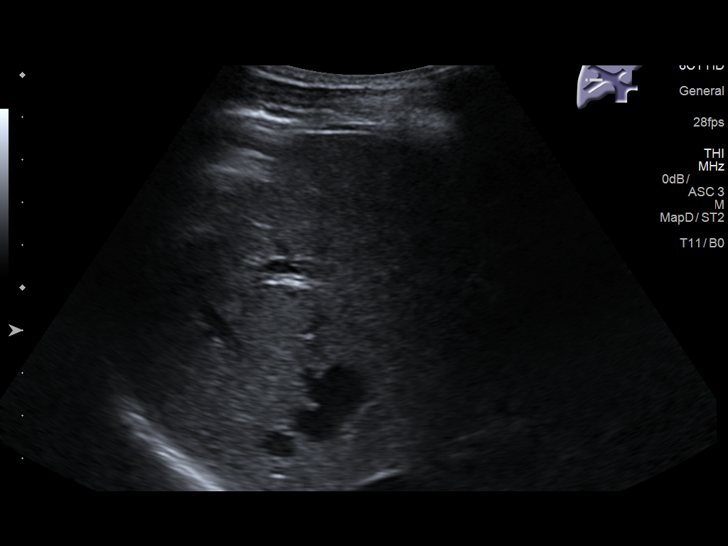
[im 46/46]
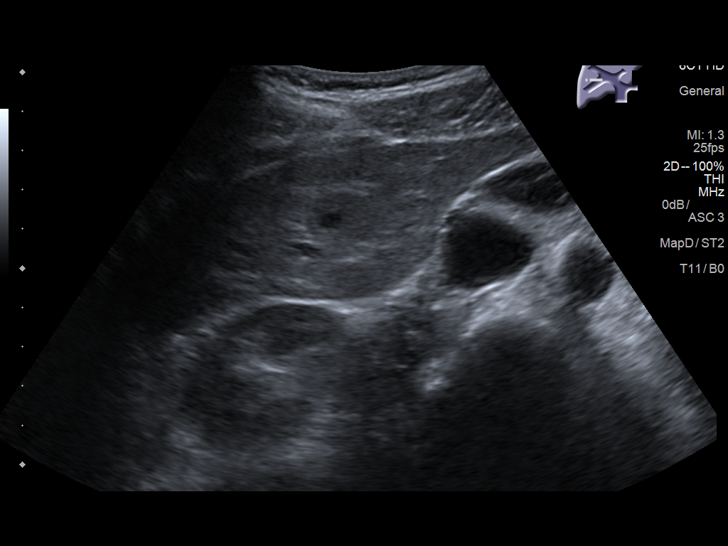

[14 of 25 positions shown; findings below may reference images not displayed]

FINDINGS: Gallbladder:

Small 3 mm posterior gallbladder wall polyp. There appears to be a 3
mm stone possibly within the cystic duct. The patient was tender
over the gallbladder during the study. No visible gallbladder stones
currently.

Common bile duct:

Diameter: Normal caliber, 3 mm

Liver:

No focal lesion identified. Within normal limits in parenchymal
echogenicity. Portal vein is patent on color Doppler imaging with
normal direction of blood flow towards the liver.
IMPRESSION: 3 mm calcification possibly within the cystic duct. No gallbladder
wall thickening, but the patient was tender over the gallbladder
during the study.

3 mm posterior gallbladder wall polyp.
